# Patient Record
Sex: Female | Born: 1960 | Race: White | Hispanic: No | Marital: Married | Smoking: Never smoker
Health system: Southern US, Community
[De-identification: ages and names within clinical notes are randomized; demographics above are authoritative.]

## PROBLEM LIST (undated history)

## (undated) DIAGNOSIS — C801 Malignant (primary) neoplasm, unspecified: Secondary | ICD-10-CM

---

## 2011-07-23 ENCOUNTER — Ambulatory Visit: Payer: Self-pay | Admitting: Family Medicine

## 2013-08-04 ENCOUNTER — Ambulatory Visit: Payer: Self-pay | Admitting: Family Medicine

## 2016-07-22 ENCOUNTER — Other Ambulatory Visit: Payer: Self-pay | Admitting: Family Medicine

## 2016-07-22 DIAGNOSIS — Z1231 Encounter for screening mammogram for malignant neoplasm of breast: Secondary | ICD-10-CM

## 2016-07-29 ENCOUNTER — Ambulatory Visit
Admission: RE | Admit: 2016-07-29 | Discharge: 2016-07-29 | Disposition: A | Discharge status: Home / Self Care | Payer: BLUE CROSS/BLUE SHIELD | Source: Ambulatory Visit | Attending: Family Medicine | Admitting: Family Medicine

## 2016-07-29 ENCOUNTER — Encounter (INDEPENDENT_AMBULATORY_CARE_PROVIDER_SITE_OTHER): Payer: Self-pay

## 2016-07-29 DIAGNOSIS — Z1231 Encounter for screening mammogram for malignant neoplasm of breast: Secondary | ICD-10-CM | POA: Diagnosis present

## 2016-07-29 HISTORY — DX: Malignant (primary) neoplasm, unspecified: C80.1

## 2016-08-18 ENCOUNTER — Other Ambulatory Visit: Payer: Self-pay | Admitting: Neurology

## 2016-08-18 DIAGNOSIS — R42 Dizziness and giddiness: Secondary | ICD-10-CM

## 2016-08-28 ENCOUNTER — Ambulatory Visit
Admission: RE | Admit: 2016-08-28 | Discharge: 2016-08-28 | Disposition: A | Discharge status: Home / Self Care | Payer: BLUE CROSS/BLUE SHIELD | Source: Ambulatory Visit | Attending: Neurology | Admitting: Neurology

## 2016-08-28 DIAGNOSIS — R42 Dizziness and giddiness: Secondary | ICD-10-CM | POA: Insufficient documentation

## 2016-08-28 MED ORDER — GADOBENATE DIMEGLUMINE 529 MG/ML IV SOLN
13.0000 mL | Freq: Once | INTRAVENOUS | Status: AC | PRN
  Administered 2016-08-28: 13 mL via INTRAVENOUS

## 2017-10-19 ENCOUNTER — Other Ambulatory Visit: Payer: Self-pay | Admitting: Family Medicine

## 2017-10-19 DIAGNOSIS — Z1231 Encounter for screening mammogram for malignant neoplasm of breast: Secondary | ICD-10-CM

## 2017-10-21 ENCOUNTER — Other Ambulatory Visit: Payer: Self-pay | Admitting: Neurology

## 2017-10-21 DIAGNOSIS — D329 Benign neoplasm of meninges, unspecified: Secondary | ICD-10-CM

## 2017-10-29 ENCOUNTER — Ambulatory Visit: Admission: RE | Admit: 2017-10-29 | Payer: BLUE CROSS/BLUE SHIELD | Source: Ambulatory Visit

## 2017-11-05 ENCOUNTER — Ambulatory Visit
Admission: RE | Admit: 2017-11-05 | Discharge: 2017-11-05 | Disposition: A | Discharge status: Home / Self Care | Payer: BLUE CROSS/BLUE SHIELD | Source: Ambulatory Visit | Attending: Neurology | Admitting: Neurology

## 2017-11-05 ENCOUNTER — Encounter (INDEPENDENT_AMBULATORY_CARE_PROVIDER_SITE_OTHER): Payer: Self-pay

## 2017-11-05 DIAGNOSIS — D329 Benign neoplasm of meninges, unspecified: Secondary | ICD-10-CM | POA: Diagnosis not present

## 2017-11-05 MED ORDER — GADOBENATE DIMEGLUMINE 529 MG/ML IV SOLN
13.0000 mL | Freq: Once | INTRAVENOUS | Status: AC | PRN
  Administered 2017-11-05: 13 mL via INTRAVENOUS

## 2017-11-09 ENCOUNTER — Ambulatory Visit
Admission: RE | Admit: 2017-11-09 | Discharge: 2017-11-09 | Disposition: A | Discharge status: Home / Self Care | Payer: BLUE CROSS/BLUE SHIELD | Source: Ambulatory Visit | Attending: Family Medicine | Admitting: Family Medicine

## 2017-11-09 DIAGNOSIS — Z1231 Encounter for screening mammogram for malignant neoplasm of breast: Secondary | ICD-10-CM

## 2018-02-10 ENCOUNTER — Other Ambulatory Visit: Payer: Self-pay | Admitting: Family Medicine

## 2018-02-10 DIAGNOSIS — R102 Pelvic and perineal pain: Secondary | ICD-10-CM

## 2018-02-14 ENCOUNTER — Ambulatory Visit
Admission: RE | Admit: 2018-02-14 | Discharge: 2018-02-14 | Disposition: A | Discharge status: Home / Self Care | Payer: BLUE CROSS/BLUE SHIELD | Source: Ambulatory Visit | Attending: Family Medicine | Admitting: Family Medicine

## 2018-02-14 ENCOUNTER — Encounter (INDEPENDENT_AMBULATORY_CARE_PROVIDER_SITE_OTHER): Payer: Self-pay

## 2018-02-14 DIAGNOSIS — R102 Pelvic and perineal pain: Secondary | ICD-10-CM | POA: Diagnosis present

## 2018-02-14 DIAGNOSIS — N858 Other specified noninflammatory disorders of uterus: Secondary | ICD-10-CM | POA: Insufficient documentation

## 2018-11-21 ENCOUNTER — Other Ambulatory Visit: Payer: Self-pay | Admitting: Family Medicine

## 2018-11-21 DIAGNOSIS — Z1231 Encounter for screening mammogram for malignant neoplasm of breast: Secondary | ICD-10-CM

## 2018-11-28 ENCOUNTER — Other Ambulatory Visit: Payer: Self-pay

## 2018-11-28 ENCOUNTER — Ambulatory Visit
Admission: RE | Admit: 2018-11-28 | Discharge: 2018-11-28 | Disposition: A | Discharge status: Home / Self Care | Payer: BLUE CROSS/BLUE SHIELD | Source: Ambulatory Visit | Attending: Family Medicine | Admitting: Family Medicine

## 2018-11-28 DIAGNOSIS — Z1231 Encounter for screening mammogram for malignant neoplasm of breast: Secondary | ICD-10-CM | POA: Insufficient documentation

## 2019-06-14 ENCOUNTER — Other Ambulatory Visit: Payer: Self-pay

## 2019-06-14 ENCOUNTER — Ambulatory Visit: Payer: BC Managed Care – PPO | Attending: Neurology

## 2019-06-14 DIAGNOSIS — R42 Dizziness and giddiness: Secondary | ICD-10-CM | POA: Diagnosis present

## 2019-06-14 NOTE — Patient Instructions (Signed)
Access Code: [email or web address]  URL: [email or web address]  Date: 06/14/2019  Prepared by: Roxana Hires   Exercises Standing Gaze Stabilization with Head Rotation - 3 reps - 30 seconds hold - 4x daily - 7x weekly

## 2019-06-14 NOTE — Therapy (Addendum)
[place] [facility] SERVICES [address] [place], [place], [address] Phone: [phone]   Fax:  [phone]   Physical Therapy Evaluation  Patient Details  Name: Heather Mcbride MRN: [id number] Date of Birth: Aug 15, 1961 Referring Provider (PT): Vertell Novak Date: 06/14/2019  PT End of Session - 06/15/19 1500    Visit Number  1    Number of Visits  9    Date for PT Re-Evaluation  08/09/19    Authorization Type  eval 06/14/19    PT Start Time  1430    PT Stop Time  1535    PT Time Calculation (min)  65 min    Activity Tolerance  Patient tolerated treatment well    Behavior During Therapy  [facility] for tasks assessed/performed       Past Medical History:  Diagnosis Date  . Cancer ([place])    melanoma and squamous cell    History reviewed. No pertinent surgical history.  There were no vitals filed for this visit.   Subjective Assessment - 06/14/19 1452    Subjective  Dizziness    Pertinent History  Pt referred by Dr. Manuella Ghazi for dizziness. She arrives complaining of  worsening dizziness for the last year but according to neurology note she has been having symptoms since July 2017. 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 She states that she had a lot of headaches as a child but denies any previous diagnosis of migraines. Per neurology not,e pt has sensitivity to light and noise. She has had multiple episodes of dizziness each of which lasts multiple days. She also has borderline hypotension. The most recent event she reports occurred in March 2020 and she had three days of eye fatigue, dizziness, and headache. She states she this past Saturday she also started feeling unwell however it was not a full blow episode. Nevertheless she reports that over the last couple days she states that she has just not felt well. In addition to these acute episodes she is  complaining of slight dizziness with quick head turns and when bending forward. She also reports that she is very sensitive to motion and gets carsick easily. She has been wearing progressive lenses for the last year and a half which were hard for her to adjust to. She also has a history of a few head injuries the most recent of which occurred after falling from a horse in 2015. After this episode she reports some word-finding difficulty. She also had a head injury when she was 58 years old. She reports that stress is a trigger and she recently had a stressful event which triggered her episode and she had associated chest pain. She denies any SOB or heart palpitations. She has a history of a left bundle branch block but does not currently see a cardiologist. She reports that she was evaluated for this issue but there were not concerned about it at the time. She does have ADD and takes methylphenidate. She states she has had multiple tick bites and she spends a lot of time outdoors. Per neurology note her migraines have recently been improving.    Diagnostic tests  MRI, no acute changes, 3mm meningioma identified of no clinical significance    Patient Stated Goals  Decrease dizziness    Currently in Pain?  No/denies             VESTIBULAR AND BALANCE  EVALUATION   HISTORY:  Subjective history of current problem: Pt referred by Dr. Manuella Ghazi for dizziness. She arrives complaining of  worsening dizziness for the last year but according to neurology note she has been having symptoms since July 2017. 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 She states that she had a lot of headaches as a child but denies any previous diagnosis of migraines. Per neurology not,e pt has sensitivity to light and noise. She has had multiple episodes of dizziness each of which lasts multiple days. She also has borderline hypotension. The most recent  event she reports occurred in March 2020 and she had three days of eye fatigue, dizziness, and headache. She states she this past Saturday she also started feeling unwell however it was not a full blow episode. Nevertheless she reports that over the last couple days she states that she has just not felt well. In addition to these acute episodes she is complaining of slight dizziness with quick head turns and when bending forward. She also reports that she is very sensitive to motion and gets carsick easily. She has been wearing progressive lenses for the last year and a half which were hard for her to adjust to. She also has a history of a few head injuries the most recent of which occurred after falling from a horse in 2015. After this episode she reports some word-finding difficulty. She also had a head injury when she was 58 years old. She reports that stress is a trigger and she recently had a stressful event which triggered her episode and she had associated chest pain. She denies any SOB or heart palpitations. She has a history of a left bundle branch block but does not currently see a cardiologist. She reports that she was evaluated for this issue but there were not concerned about it at the time. She does have ADD and takes methylphenidate. She states she has had multiple tick bites and she spends a lot of time outdoors. Per neurology note her migraines have recently been improving.    Description of dizziness: (vertigo, unsteadiness, lightheadedness, falling, general unsteadiness, whoozy, swimmy-headed sensation, aural fullness) lightheadedness, dizziness, no complaints of vertigo Frequency: "a couple times per week" Duration: a couple seconds Symptom nature: (motion provoked, positional, spontaneous, constant, variable, intermittent) always motion-provoked and exertional.   Provocative Factors: Bright lights, smells, stress, sudden movements of the head, hx of low blood pressure Easing Factors:  resting  Progression of symptoms: (better, worse, no change since onset) No change History of similar episodes: 1-2 acute episodes a year for the last couple of years  Falls (yes/no): No Number of falls in past 6 months: No   Prior Functional Level: Independent  Auditory complaints (tinnitus, pain, drainage, hearing loss, aural fullness): No hearing, fullness, or ringing Vision (diplopia, visual field loss, recent changes, last eye exam): Denies diplopia, visual field loss. Last vision exam was over a year and was put into progressive lenses which took a long time to adjust to  Red Flags: (dysarthria, dysphagia, drop attacks, bowel and bladder changes, recent weight loss/gain) Review of systems negative for red flags.   MRI Brain with and without contrast done on 08/28/2016:  1. Largely unremarkable appearance of the brain for age. No cause of the patient's symptoms identified.  2. 6 mm enhancing focus associated with the falx which may reflect a tiny incidental meningioma or venous structure, not felt to be  of any clinical significance.  MRI Brain 11/05/2017 - 4 x 6 mm meningioma along the anterior falx unchanged from the prior study. Otherwise negative      EXAMINATION  POSTURE:   NEUROLOGICAL SCREEN: (2+ unless otherwise noted.) N=normal  Ab=abnormal  Level Dermatome R L Myotome R L Reflex R L  C3 Anterior Neck N N Sidebend C2-3 N N Jaw CN V    C4 Top of Shoulder N N Shoulder Shrug C4 N N Hoffman's UMN N N  C5 Lateral Upper Arm N N Shoulder ABD C4-5 N N Biceps C5-6    C6 Lateral Arm/ Thumb N N Arm Flex/ Wrist Ext C5-6 N N Brachiorad. C5-6    C7 Middle Finger N N Arm Ext//Wrist Flex C6-7 N N Triceps C7    C8 4th & 5th Finger N N Flex/ Ext Carpi Ulnaris C8 N N Patellar (L3-4)    T1 Medial Arm N N Interossei T1 N N Gastrocnemius    L2 Medial thigh/groin N N Illiopsoas (L2-3) N N     L3 Lower thigh/med.knee N N Quadriceps (L3-4) N N     L4 Medial leg/lat thigh N N Tibialis Ant  (L4-5) N N     L5 Lat. leg & dorsal foot N N EHL (L5) N N     S1 post/lat foot/thigh/leg N N Gastrocnemius (S1-2) N N     S2 Post./med. thigh & leg N N Hamstrings (L4-S3) N N       Cranial Nerves Visual acuity and visual fields are intact  Extraocular muscles are intact  Facial sensation is intact bilaterally  Facial strength is intact bilaterally  Hearing is normal as tested by gross conversation Palate elevates midline, normal phonation  Shoulder shrug strength is intact  Tongue protrudes midline    SOMATOSENSORY:         Sensation           Intact      Diminished         Absent  Light touch Normal      COORDINATION: Finger to Nose: Normal Heel to Shin: Normal Pronator Drift: Negative Rapid Alternating Movements: Normal Finger to Thumb Opposition: Normal  MUSCULOSKELETAL SCREEN: Cervical Spine ROM: WFL and painless in all planes. No gross deficits identified   ROM: WNL  MMT: WNL, no focal weakness  Functional Mobility: Independent with all transfers and ambulation without assistive device.   Gait: Scanning of visual environment with gait is: WNL without gross deficits noted   OCULOMOTOR / VESTIBULAR TESTING:  Oculomotor Exam- Room Light  Findings Comments  Ocular Alignment normal   Ocular ROM normal   Spontaneous Nystagmus normal   Gaze-Holding Nystagmus normal   End-Gaze Nystagmus normal   Vergence (normal 2-3") normal   Smooth Pursuit normal   Cross-Cover Test normal   Saccades normal   VOR Cancellation normal   Left Head Thrust abnormal Possibly positive to the left requiring a corrective saccade but very faint  Right Head Thrust normal   Static Acuity normal 20/13  Dynamic Acuity abnormal 20/25 (3 line loss)    Oculomotor Exam- Fixation Suppressed  Findings Comments  Ocular Alignment normal   Spontaneous Nystagmus normal   Gaze-Holding Nystagmus normal   End-Gaze Nystagmus normal   Head Shaking Nystagmus normal   Pressure-Induced Nystagmus  not examined   Hyperventilation Induced Nystagmus not examined   Skull Vibration Induced Nystagmus abnormal Possible faint R horizontal beating nystagmus    BPPV TESTS:  Symptoms Duration Intensity  Nystagmus  L Dix-Hallpike None   None  R Dix-Hallpike None   None  L Head Roll None   None  R Head Roll None   None  L Sidelying Test      R Sidelying Test        FUNCTIONAL OUTCOME MEASURES   Results Comments  DHI 28/100 Mild perception of handicap  ABC 94.4% WNL          [facility] PT Assessment - 06/14/19 1453      Assessment   Medical Diagnosis  Dizziness    Referring Provider (PT)  Manuella Ghazi    Onset Date/Surgical Date  06/13/18    Hand Dominance  Right    Next MD Visit  November 2020    Prior Therapy  None      Precautions   Precautions  None      Restrictions   Weight Bearing Restrictions  No      Balance Screen   Has the patient fallen in the past 6 months  No    Has the patient had a decrease in activity level because of a fear of falling?   No    Is the patient reluctant to leave their home because of a fear of falling?   No      Home Film/video editor residence    Living Arrangements  Spouse/significant other    Available Help at Discharge  Family    Type of [place]      Prior Function   Level of [place]  Retired    Leisure  Works around [employer], horseback riding, tennis, golf, shopping, camping      Cognition   Overall Cognitive Status  Within Functional Limits for tasks assessed      Observation/Other Assessments   Other Surveys   Dizziness Handicap Inventory ([place])    Dizziness Handicap Inventory ([place])   28/100                Objective measurements completed on examination: See above findings.       TREATMENT   Neuromuscular Re-education  VOR x 1 horizontal in sitting with patient x 60s, pt reports 7/10 dizziness. Did not perform a second rep but spent additional time  explaining how to perform HEP and how to modify as needed to appropriate intensity. Written handout provided and education given to both patient and husband.        PT Education - 06/15/19 1444    Education Details  Plan of care    Person(s) Educated  Patient;Spouse    Methods  Explanation    Comprehension  Verbalized understanding       PT Short Term Goals - 06/15/19 1512      PT SHORT TERM GOAL #1   Title  Pt will be independent with HEP in order to decrease dizziness in order to improve symptom-free function at home and with leisure activities    Time  4    Period  Weeks    Status  New    Target Date  07/12/19        PT Long Term Goals - 06/15/19 1512      PT LONG TERM GOAL #1   Title  Pt will decrease DHI score by at least 18 points in order to demonstrate clinically significant reduction in disability    Baseline  06/14/19: 28/100    Time  8  Period  Weeks    Status  New    Target Date  08/09/19      PT LONG TERM GOAL #2   Title  Pt will report no further episodes of dizziness while riding in the car in order to resume leisure activities such as going camping with her husband    Time  8    Period  Weeks    Status  New    Target Date  08/09/19             Plan - 06/15/19 1508    Clinical Impression Statement  Pt is a pleasant 58 year-old female referred for dizziness. PT examination reveals normal strength, sensation, and neurological testing. Oculomotor testing reveals possible positive head thrust to the L as well as faint pure horizontal R beating vibration-induced nystagmus. Pt also with 3 line loss during dynamic visual acuity testing. Otherwise, additional oculmotor and BPPV testing is negative on this date. Pt has acute episodes of dizziness which last multiple days and appear to be consistent with vestibular migraines for which she is being treated by neurology. 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 She reports significant increase in dizziness with VOR x 1 horizontal exercise which is encouraging for symptom reproduction. She will benefit from skilled PT services to address deficits in balance and decrease risk for future falls.    Personal Factors and Comorbidities  Comorbidity 3+    Comorbidities  borderline low BP, depression, LBBB, ADHD    Examination-Activity Limitations  Locomotion Level;Other   Bending   Examination-Participation Restrictions  Community Activity;Driving;Yard Work;Shop;Other   Leisure activities   Stability/Clinical Decision Making  Evolving/Moderate complexity    Clinical Decision Making  Moderate    Rehab Potential  Good    PT Frequency  1x / week    PT Duration  8 weeks    PT Treatment/Interventions  ADLs/Self Care Home Management;Aquatic Therapy;Biofeedback;Canalith Repostioning;Cryotherapy;Electrical Stimulation;Iontophoresis 4mg /ml Dexamethasone;Moist Heat;Traction;Ultrasound;DME Instruction;Gait training;Stair training;Functional mobility training;Therapeutic activities;Therapeutic exercise;Balance training;Neuromuscular re-education;Patient/family education;Manual techniques;Passive range of motion;Dry needling;Vestibular;Spinal Manipulations;Joint Manipulations    PT Next Visit Plan  DGI, mCTSIB, update goals with new outcome measures if needed, review VOR x 1 horizontal and progress as appropriate for HEP;    PT Home Exercise Plan  Access Code: [email or web address], VOR x 1 horizontal in sitting x 30s, 3 reps 4x/day, 7d/wk    Consulted and Agree with Plan of Care  Patient;Family member/caregiver    Family Member Consulted  Husband       Patient will benefit from skilled therapeutic intervention in order to improve the following deficits and impairments:  Dizziness  Visit Diagnosis: Dizziness and giddiness     Problem List There are no  active problems to display for this patient.  Phillips Grout PT, DPT, GCS  Perez Dirico 06/15/2019, 4:13 PM  [place] MAIN [facility] SERVICES [address] [place], [place], [address] Phone: [phone]   Fax:  [phone]  Name: Heather Mcbride MRN: [id number] Date of Birth: 1961-07-12

## 2019-06-15 NOTE — Addendum Note (Signed)
Addended by: Roxana Hires D on: 06/15/2019 04:15 PM   Modules accepted: Orders

## 2019-06-23 ENCOUNTER — Ambulatory Visit: Payer: BC Managed Care – PPO | Attending: Neurology

## 2019-06-23 ENCOUNTER — Other Ambulatory Visit: Payer: Self-pay

## 2019-06-23 DIAGNOSIS — R42 Dizziness and giddiness: Secondary | ICD-10-CM | POA: Diagnosis not present

## 2019-06-23 NOTE — Therapy (Signed)
[place] MAIN [facility] SERVICES [address] [place], [place], [address] Phone: [phone]   Fax:  [phone]  Physical Therapy Treatment  Patient Details  Name: Heather Mcbride MRN: [id number] Date of Birth: [birth year] Referring Provider (PT): Vertell Novak Date: 06/23/2019  PT End of Session - 06/23/19 1011    Visit Number  2    Number of Visits  9    Date for PT Re-Evaluation  08/09/19    Authorization Type  2/10 progress note    PT Start Time  0810    PT Stop Time  0853    PT Time Calculation (min)  43 min    Equipment Utilized During Treatment  Gait belt    Activity Tolerance  Patient tolerated treatment well    Behavior During Therapy  [facility] for tasks assessed/performed       Past Medical History:  Diagnosis Date  . Cancer ([place])    melanoma and squamous cell    History reviewed. No pertinent surgical history.  There were no vitals filed for this visit.  Subjective Assessment - 06/23/19 0812    Subjective  Patient reports she has been doing her home exercises and she states it is getting better. Patient reports she went camping for 3 nights and she said she was less symptomatic. Patient reports that she usually gets motion sensitivity with camping because of the movmeent in the motor home.    Pertinent History  Pt referred by Dr. Manuella Ghazi for dizziness. She arrives complaining of  worsening dizziness for the last year but according to neurology note she has been having symptoms since July 2017. 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 She states that she had a lot of headaches as a child but denies any previous diagnosis of migraines. Per neurology not,e pt has sensitivity to light and noise. She has had multiple episodes of dizziness each of which lasts multiple days. She also has borderline hypotension. The most recent event she reports occurred in  March 2020 and she had three days of eye fatigue, dizziness, and headache. She states she this past Saturday she also started feeling unwell however it was not a full blow episode. Nevertheless she reports that over the last couple days she states that she has just not felt well. In addition to these acute episodes she is complaining of slight dizziness with quick head turns and when bending forward. She also reports that she is very sensitive to motion and gets carsick easily. She has been wearing progressive lenses for the last year and a half which were hard for her to adjust to. She also has a history of a few head injuries the most recent of which occurred after falling from a horse in 2015. After this episode she reports some word-finding difficulty. She also had a head injury when she was 58 years old. She reports that stress is a trigger and she recently had a stressful event which triggered her episode and she had associated chest pain. She denies any SOB or heart palpitations. She has a history of a left bundle branch block but does not currently see a cardiologist. She reports that she was evaluated for this issue but there were not concerned about it at the time. She does have ADD and takes methylphenidate. She states she has had multiple tick bites and she spends a lot of time outdoors. Per  neurology note her migraines have recently been improving.    Diagnostic tests  MRI, no acute changes, 33mm meningioma identified of no clinical significance    Patient Stated Goals  Decrease dizziness        [facility] PT Assessment - 06/23/19 0001      Standardized Balance Assessment   Standardized Balance Assessment  Dynamic Gait Index      Dynamic Gait Index   Level Surface  Normal    Change in Gait Speed  Normal    Gait with Horizontal Head Turns  Normal    Gait with Vertical Head Turns  Normal    Gait and Pivot Turn  Normal    Step Over Obstacle  Normal    Step Around Obstacles  Normal    Steps   Normal    Total Score  24       Patient reports 0/10 dizziness upon arrival.  Neuromuscular Re-education:  VOR X 1 exercise:  Patient performed VOR X 1 horizontal in sitting 1 rep of 30 seconds with verbal cues for technique as initially patient was turning head rapidly and reporting target was moving and blurring. Patient instructed to move head more slowly trying to keep object in focus. Patient reports the X stays in focus but does still appear to move. Patient performed VOR X 1 using chin tuck technique.   Patient performed VOR X 1 horizontal in standing 3 reps of 1 minute each.  Patient reports the target "moves" despite slower speeds and reports 2/10 dizziness.   Body Wall Rolls:  Patient performed 4 reps of supported, body wall rolls with eyes open with CGA. Patient veered gently into wall once.  Patient reports mild dizziness with this activity.   Ambulation with head turns:  Patient performed 175' forwards and retro ambulation while scanning for targets in hallway with CGA.  Patient demonstrated one episode of mild veering that she was able to self-correct.  Patient with mild increased sway upon stopping ambulation.  Diona Foley toss to self:  Patient performed 175' forwards and then retro ambulation while tossing ball to self horizontal and then vertical while tracking ball with head and eyes with CGA.  Patient denies dizziness with this activity.    Clinical Test of Sensory Interaction for Balance    (CTSIB): CONDITION TIME STRATEGY SWAY  Eyes open, firm surface 30 seconds    Eyes closed, firm surface 30 seconds ankle +1  Eyes open, foam surface 30 seconds ankle   Eyes closed, foam surface 30 seconds ankle +2     PT Education - 06/23/19 1010    Education Details  reviewed VOR X1 and added progressions of performing in standing for 60 second reps    Person(s) Educated  Patient    Methods  Explanation    Comprehension  Verbalized understanding       PT Short Term Goals -  06/15/19 1512      PT SHORT TERM GOAL #1   Title  Pt will be independent with HEP in order to decrease dizziness in order to improve symptom-free function at home and with leisure activities    Time  4    Period  Weeks    Status  New    Target Date  07/12/19        PT Long Term Goals - 06/15/19 1512      PT LONG TERM GOAL #1   Title  Pt will decrease DHI score by at least 18 points in order  to demonstrate clinically significant reduction in disability    Baseline  06/14/19: 28/100    Time  8    Period  Weeks    Status  New    Target Date  08/09/19      PT LONG TERM GOAL #2   Title  Pt will report no further episodes of dizziness while riding in the car in order to resume leisure activities such as going camping with her husband    Time  8    Period  Weeks    Status  New    Target Date  08/09/19            Plan - 06/23/19 1011    Clinical Impression Statement  Patient reports compliance with HEP. Patient able to progress to standing VOR and progressed from 30 seconds to 1 minute reps. Patient challenged by retro ambulation with head turns and with ambulation with ball toss to self with head and eye follows. Performed the CTSIB test and patient challenged by eyes closed on firm and foam surfaces. Will plan on practicing eyes closed on foam exercises next sesion as well as working on [employer] with ball toss over shoulder. Patient would benefit from continued PT services to further address patient's symptoms and to address goals as set on plan of care.    Personal Factors and Comorbidities  Comorbidity 3+    Comorbidities  borderline low BP, depression, LBBB, ADHD    Examination-Activity Limitations  Locomotion Level;Other   Bending   Examination-Participation Restrictions  Community Activity;Driving;Yard Work;Shop;Other   Leisure activities   Stability/Clinical Decision Making  Evolving/Moderate complexity    Rehab Potential  Good    PT Frequency  1x / week    PT Duration   8 weeks    PT Treatment/Interventions  ADLs/Self Care Home Management;Aquatic Therapy;Biofeedback;Canalith Repostioning;Cryotherapy;Electrical Stimulation;Iontophoresis 4mg /ml Dexamethasone;Moist Heat;Traction;Ultrasound;DME Instruction;Gait training;Stair training;Functional mobility training;Therapeutic activities;Therapeutic exercise;Balance training;Neuromuscular re-education;Patient/family education;Manual techniques;Passive range of motion;Dry needling;Vestibular;Spinal Manipulations;Joint Manipulations    PT Next Visit Plan  DGI, mCTSIB, update goals with new outcome measures if needed, review VOR x 1 horizontal and progress as appropriate for HEP;    PT Home Exercise Plan  Access Code: [email or web address], VOR x 1 horizontal in sitting x 30s, 3 reps 4x/day, 7d/wk    Consulted and Agree with Plan of Care  Patient;Family member/caregiver    Family Member Consulted  Husband       Patient will benefit from skilled therapeutic intervention in order to improve the following deficits and impairments:  Dizziness  Visit Diagnosis: Dizziness and giddiness     Problem List There are no active problems to display for this patient.  Lady Deutscher PT, DPT [phone] Lady Deutscher 06/23/2019, 10:23 AM  [place] MAIN [facility] SERVICES [address] [place], [place], [address] Phone: [phone]   Fax:  [phone]  Name: Heather Mcbride MRN: [id number] Date of Birth: 05/09/1961

## 2019-06-30 ENCOUNTER — Other Ambulatory Visit: Payer: Self-pay

## 2019-06-30 ENCOUNTER — Ambulatory Visit: Payer: BC Managed Care – PPO

## 2019-06-30 DIAGNOSIS — R42 Dizziness and giddiness: Secondary | ICD-10-CM

## 2019-06-30 NOTE — Patient Instructions (Signed)
Access Code: [email or web address]  URL: [email or web address]  Date: 06/30/2019  Prepared by: Roxana Hires   Exercises Standing Gaze Stabilization with Head Rotation - 3 reps - 60 seconds hold - 4x daily - 7x weekly Half Tandem Stance Balance with Head Rotation - 3 reps - 30s x 3 with each foot forward hold - 4x daily - 7x weekly

## 2019-06-30 NOTE — Therapy (Signed)
[place] MAIN [facility] SERVICES [address] [place], [place], [address] Phone: [phone]   Fax:  [phone]  Physical Therapy Treatment  Patient Details  Name: Heather Mcbride MRN: [id number] Date of Birth: May 13, 1961 Referring Provider (PT): Vertell Novak Date: 06/30/2019  PT End of Session - 06/30/19 0852    Visit Number  3    Number of Visits  9    Date for PT Re-Evaluation  08/09/19    Authorization Type  3/10 progress note    PT Start Time  0812    PT Stop Time  0853    PT Time Calculation (min)  41 min    Equipment Utilized During Treatment  Gait belt    Activity Tolerance  Patient tolerated treatment well    Behavior During Therapy  [facility] for tasks assessed/performed       Past Medical History:  Diagnosis Date  . Cancer ([place])    melanoma and squamous cell    History reviewed. No pertinent surgical history.  There were no vitals filed for this visit.  Subjective Assessment - 06/30/19 0813    Subjective  Pt reports that she went kayaking this past week and felt some dizziness, but feels about 40% better from the start of therapy.    Pertinent History  Pt referred by Dr. Manuella Ghazi for dizziness. She arrives complaining of  worsening dizziness for the last year but according to neurology note she has been having symptoms since July 2017. 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 She states that she had a lot of headaches as a child but denies any previous diagnosis of migraines. Per neurology not,e pt has sensitivity to light and noise. She has had multiple episodes of dizziness each of which lasts multiple days. She also has borderline hypotension. The most recent event she reports occurred in March 2020 and she had three days of eye fatigue, dizziness, and headache. She states she this past Saturday she also started feeling unwell however it was not  a full blow episode. Nevertheless she reports that over the last couple days she states that she has just not felt well. In addition to these acute episodes she is complaining of slight dizziness with quick head turns and when bending forward. She also reports that she is very sensitive to motion and gets carsick easily. She has been wearing progressive lenses for the last year and a half which were hard for her to adjust to. She also has a history of a few head injuries the most recent of which occurred after falling from a horse in 2015. After this episode she reports some word-finding difficulty. She also had a head injury when she was 58 years old. She reports that stress is a trigger and she recently had a stressful event which triggered her episode and she had associated chest pain. She denies any SOB or heart palpitations. She has a history of a left bundle branch block but does not currently see a cardiologist. She reports that she was evaluated for this issue but there were not concerned about it at the time. She does have ADD and takes methylphenidate. She states she has had multiple tick bites and she spends a lot of time outdoors. Per neurology note her migraines have recently been improving.    Diagnostic tests  MRI, no acute changes, 44mm meningioma identified of no clinical significance  Patient Stated Goals  Decrease dizziness    Currently in Pain?  No/denies          TREATMENT   Neuromuscular Re-education:  VOR X 1 exercise:   Patient performed VOR X 1 horizontal in standing 4 reps of 1 minute each. 1st set against mirror with 0/10 dizziness reported. 2nd set against patterned curtain with feet together and 0/10 dizziness reported; 3rd set performed against patterned background in tandem stance with increased speed and 6/10 dizziness; 4th set with word "cat", feet together on patterned background with increased speed and 7/10 dizziness  Double Body Wall Rolls:  Patient performed  3 reps of supported, body wall rolls with eyes open with CGA. Patient veered gently into wall once.  Patient reports up to an 8/10 dizziness with exercise with more provocation going to the L  Ambulation with head turns:  Patient performed 2x75' forwards and retro ambulation while scanning for targets in hallway with CGA. No reported dizziness and no veering or unsteadiness noted throughout today.  Diona Foley toss to self:  Forward and Retro gait with vertical ball toss with head/eye follow 37' in each direction.  No dizziness reported  Forward and retro gait with horizontal ball passes from L hand to R with head/eye follow 73' in each direction.  2/10 dizziness reported with retro gait  Forward and retro gait with ball bounces to self alternating sides 75' in each direciton, Patient denies dizziness with this activity.     Horizontal head turns in // bars: 1/2 tandem with horizontal head turns and full tandem with horizontal head turns with increased dizziness noted up to a 4/10.     Pt demonstrates less provocation of symptoms today with VORx1 and forward and retrogait activities.  She reports significant dizziness with double body rolls, endorsing up to an 8/10 dizziness that was worse going to the L compared to the R.  Pt's HEP was progressed today to standing gaze stabilization with head rotations and half tandem stance balance with head turns.  Pt will benefit from skilled PT services to decrease dizziness and return to full function at home and in the community.           PT Short Term Goals - 06/15/19 1512      PT SHORT TERM GOAL #1   Title  Pt will be independent with HEP in order to decrease dizziness in order to improve symptom-free function at home and with leisure activities    Time  4    Period  Weeks    Status  New    Target Date  07/12/19        PT Long Term Goals - 06/15/19 1512      PT LONG TERM GOAL #1   Title  Pt will decrease DHI score by at least 18  points in order to demonstrate clinically significant reduction in disability    Baseline  06/14/19: 28/100    Time  8    Period  Weeks    Status  New    Target Date  08/09/19      PT LONG TERM GOAL #2   Title  Pt will report no further episodes of dizziness while riding in the car in order to resume leisure activities such as going camping with her husband    Time  8    Period  Weeks    Status  New    Target Date  08/09/19  Plan - 06/30/19 1017    Clinical Impression Statement  Pt demonstrates less provocation of symptoms today with VORx1 and forward and retrogait activities.  She reports significant dizziness with double body rolls, endorsing up to an 8/10 dizziness that was worse going to the L compared to the R.  Pt's HEP was progressed today to standing gaze stabilization with head rotations and half tandem stance balance with head turns.  Pt will benefit from skilled PT services to decrease dizziness and return to full function at home and in the community.    Personal Factors and Comorbidities  Comorbidity 3+    Comorbidities  borderline low BP, depression, LBBB, ADHD    Examination-Activity Limitations  Locomotion Level;Other   Bending   Examination-Participation Restrictions  Community Activity;Driving;Yard Work;Shop;Other   Leisure activities   Stability/Clinical Decision Making  Evolving/Moderate complexity    Rehab Potential  Good    PT Frequency  1x / week    PT Duration  8 weeks    PT Treatment/Interventions  ADLs/Self Care Home Management;Aquatic Therapy;Biofeedback;Canalith Repostioning;Cryotherapy;Electrical Stimulation;Iontophoresis 4mg /ml Dexamethasone;Moist Heat;Traction;Ultrasound;DME Instruction;Gait training;Stair training;Functional mobility training;Therapeutic activities;Therapeutic exercise;Balance training;Neuromuscular re-education;Patient/family education;Manual techniques;Passive range of motion;Dry needling;Vestibular;Spinal  Manipulations;Joint Manipulations    PT Next Visit Plan  DGI, mCTSIB, update goals with new outcome measures if needed, review VOR x 1 horizontal and progress as appropriate for HEP;    PT Home Exercise Plan  Access Code: [email or web address], VOR x 1 horizontal in standing x 60s, 3 reps 4x/day, 7d/wk in front of patterned background, semitandem horizontal head turns;    Consulted and Agree with Plan of Care  Patient       Patient will benefit from skilled therapeutic intervention in order to improve the following deficits and impairments:  Dizziness  Visit Diagnosis: Dizziness and giddiness     Problem List There are no active problems to display for this patient.   This entire session was performed under direct supervision and direction of a licensed therapist/therapist assistant . I have personally read, edited and approve of the note as written.   Lutricia Horsfall, SPT Phillips Grout PT, DPT, GCS  Huprich,Jason 06/30/2019, 12:01 PM  [place] MAIN [facility] SERVICES [address] [place], [place], [address] Phone: [phone]   Fax:  [phone]  Name: Heather Mcbride MRN: [id number] Date of Birth: [birth year]

## 2019-07-06 ENCOUNTER — Ambulatory Visit: Payer: BC Managed Care – PPO

## 2019-07-13 ENCOUNTER — Ambulatory Visit: Payer: BC Managed Care – PPO

## 2019-07-13 ENCOUNTER — Other Ambulatory Visit: Payer: Self-pay

## 2019-07-13 DIAGNOSIS — R42 Dizziness and giddiness: Secondary | ICD-10-CM

## 2019-07-13 NOTE — Therapy (Addendum)
[place] MAIN [facility] SERVICES [address] [place], [place], [address] Phone: [phone]   Fax:  [phone]  Physical Therapy Treatment  Patient Details  Name: Heather Mcbride MRN: [id number] Date of Birth: 1961-01-11 Referring Provider (PT): Vertell Novak Date: 07/13/2019  PT End of Session - 07/13/19 1119    Visit Number  4    Number of Visits  9    Date for PT Re-Evaluation  08/09/19    Authorization Type  --    PT Start Time  1120    PT Stop Time  1205    PT Time Calculation (min)  45 min    Equipment Utilized During Treatment  Gait belt    Activity Tolerance  Patient tolerated treatment well    Behavior During Therapy  WFL for tasks assessed/performed       Past Medical History:  Diagnosis Date  . Cancer ([place])    melanoma and squamous cell    History reviewed. No pertinent surgical history.  There were no vitals filed for this visit.  Subjective Assessment - 07/13/19 1123    Subjective  Pt reports that she has been doing her HEP twice a day, but has not been able to do them more than that.  She reports that she has had a few bad days after last session lasting up to 3 days.  She reports that she feels better today.    Pertinent History  Pt referred by Dr. Manuella Ghazi for dizziness. She arrives complaining of  worsening dizziness for the last year but according to neurology note she has been having symptoms since July 2017. 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 She states that she had a lot of headaches as a child but denies any previous diagnosis of migraines. Per neurology not,e pt has sensitivity to light and noise. She has had multiple episodes of dizziness each of which lasts multiple days. She also has borderline hypotension. The most recent event she reports occurred in March 2020 and she had three days of eye fatigue, dizziness, and  headache. She states she this past Saturday she also started feeling unwell however it was not a full blow episode. Nevertheless she reports that over the last couple days she states that she has just not felt well. In addition to these acute episodes she is complaining of slight dizziness with quick head turns and when bending forward. She also reports that she is very sensitive to motion and gets carsick easily. She has been wearing progressive lenses for the last year and a half which were hard for her to adjust to. She also has a history of a few head injuries the most recent of which occurred after falling from a horse in 2015. After this episode she reports some word-finding difficulty. She also had a head injury when she was 58 years old. She reports that stress is a trigger and she recently had a stressful event which triggered her episode and she had associated chest pain. She denies any SOB or heart palpitations. She has a history of a left bundle branch block but does not currently see a cardiologist. She reports that she was evaluated for this issue but there were not concerned about it at the time. She does have ADD and takes methylphenidate. She states she has had multiple tick bites and she spends a lot of time outdoors. Per neurology  note her migraines have recently been improving.    Diagnostic tests  MRI, no acute changes, 109mm meningioma identified of no clinical significance    Patient Stated Goals  Decrease dizziness    Currently in Pain?  No/denies          TREATMENT   Neuromuscular Re-education  VOR X 1 exercise  VORx1 horizontal in semitandem on firm surface 2x60; reports 2/10 dizziness that resolves quickly after stopping  Patient performed VOR X 1 horizontal in standing on foam with patterned background; 1x60s; 1/10 dizziness with feet shoulder width apart with a chin tuck; 1x60s with feet together on foam 1/10 dizziness resolving quickly  VORx1 horizontal with forward  walking 3x forward and 3x backwards 1/10 dizziness reported with forward gait, and 1/10 during first 2 reps, and 2/10 dizziness during 3rd rep.  Ambulation with 180 degree turns:  Forward gait with x6 turns to the L and x6 turns to the R; pt reports 0/10 dizziness  Ball toss to self:  Forward and Retro gait with vertical ball toss with head/eye follow 32' in each direction.  1/10 dizziness reported  Forward and retro gait with horizontal ball passes from L hand to R with head/eye follow 76' in each direction.  1/10 dizziness reported with retro gait  Forward and retro gait with ball bounces to self alternating sides 75' in each direciton, Patient denies dizziness with this activity.   Lateral ball passes in door frame with head/turn follow x60s; 0/10 dizziness reported    Pt reports less provocation of symptoms today with all exercises, endorsing at most a 1/10 dizziness.  She reports that the main thing that sets off her symptoms at home is walking her horses in a circular pattern while trying to focus on the horse.  She will re-initiate single body rolls at home this week, starting with 3 in each direction once a day, and then progressing as tolerated.  Pt will benefit from skilled PT services to decrease dizziness and return to full function at home and in the community.                         PT Short Term Goals - 06/15/19 1512      PT SHORT TERM GOAL #1   Title  Pt will be independent with HEP in order to decrease dizziness in order to improve symptom-free function at home and with leisure activities    Time  4    Period  Weeks    Status  New    Target Date  07/12/19        PT Long Term Goals - 06/15/19 1512      PT LONG TERM GOAL #1   Title  Pt will decrease DHI score by at least 18 points in order to demonstrate clinically significant reduction in disability    Baseline  06/14/19: 28/100    Time  8    Period  Weeks    Status  New    Target  Date  08/09/19      PT LONG TERM GOAL #2   Title  Pt will report no further episodes of dizziness while riding in the car in order to resume leisure activities such as going camping with her husband    Time  8    Period  Weeks    Status  New    Target Date  08/09/19  Plan - 07/13/19 1229    Clinical Impression Statement  Pt reports less provocation of symptoms today with all exercises, endorsing at most a 1/10 dizziness.  She reports that the main thing that sets off her symptoms at home is walking her horses in a circular pattern while trying to focus on the horse.  She will re-initiate single body rolls at home this week, starting with 3 in each direction once a day, and then progressing as tolerated.  Pt will benefit from skilled PT services to decrease dizziness and return to full function at home and in the community.    Personal Factors and Comorbidities  Comorbidity 3+    Comorbidities  borderline low BP, depression, LBBB, ADHD    Examination-Activity Limitations  Locomotion Level;Other   Bending   Examination-Participation Restrictions  Community Activity;Driving;Yard Work;Shop;Other   Leisure activities   Stability/Clinical Decision Making  Evolving/Moderate complexity    Rehab Potential  Good    PT Frequency  1x / week    PT Duration  8 weeks    PT Treatment/Interventions  ADLs/Self Care Home Management;Aquatic Therapy;Biofeedback;Canalith Repostioning;Cryotherapy;Electrical Stimulation;Iontophoresis 4mg /ml Dexamethasone;Moist Heat;Traction;Ultrasound;DME Instruction;Gait training;Stair training;Functional mobility training;Therapeutic activities;Therapeutic exercise;Balance training;Neuromuscular re-education;Patient/family education;Manual techniques;Passive range of motion;Dry needling;Vestibular;Spinal Manipulations;Joint Manipulations    PT Next Visit Plan  DGI, mCTSIB, update goals with new outcome measures if needed, review VOR x 1 horizontal and progress as  appropriate for HEP;    PT Home Exercise Plan  Access Code: [email or web address], VOR x 1 horizontal in standing x 60s, 3 reps 4x/day, 7d/wk in front of patterned background, semitandem horizontal head turns;    Consulted and Agree with Plan of Care  Patient       Patient will benefit from skilled therapeutic intervention in order to improve the following deficits and impairments:  Dizziness  Visit Diagnosis: Dizziness and giddiness     Problem List There are no active problems to display for this patient.   This entire session was performed under direct supervision and direction of a licensed therapist/therapist assistant . I have personally read, edited and approve of the note as written.   Lutricia Horsfall, SPT Phillips Grout PT, DPT, GCS  Huprich,Jason 07/14/2019, 1:05 PM  [place] MAIN [facility] SERVICES [address] [place], [place], [address] Phone: [phone]   Fax:  [phone]  Name: Heather Mcbride MRN: [id number] Date of Birth: [birth year]

## 2019-07-20 ENCOUNTER — Ambulatory Visit: Payer: BC Managed Care – PPO

## 2019-07-26 ENCOUNTER — Ambulatory Visit: Payer: BC Managed Care – PPO | Attending: Neurology

## 2019-07-26 ENCOUNTER — Other Ambulatory Visit: Payer: Self-pay

## 2019-07-26 DIAGNOSIS — R42 Dizziness and giddiness: Secondary | ICD-10-CM

## 2019-07-26 NOTE — Therapy (Signed)
[place] MAIN [facility] SERVICES [address] [place], [place], [address] Phone: [phone]   Fax:  [phone]  Physical Therapy Treatment  Patient Details  Name: Heather Mcbride MRN: [id number] Date of Birth: 08-17-1961 Referring Provider (PT): Vertell Novak Date: 07/26/2019  PT End of Session - 07/26/19 1118    Visit Number  5    Number of Visits  9    Date for PT Re-Evaluation  08/09/19    PT Start Time  1118    PT Stop Time  1150    PT Time Calculation (min)  32 min    Equipment Utilized During Treatment  Gait belt    Activity Tolerance  Patient tolerated treatment well    Behavior During Therapy  WFL for tasks assessed/performed       Past Medical History:  Diagnosis Date  . Cancer ([place])    melanoma and squamous cell    History reviewed. No pertinent surgical history.  There were no vitals filed for this visit.  Subjective Assessment - 07/26/19 1217    Subjective  Pt reports that she hasn't experienced any dizziness recently.  She reports that she hasn't been able to do her HEP consistently, but states that her dizziness has not been problematic.    Pertinent History  Pt referred by Dr. Manuella Ghazi for dizziness. She arrives complaining of  worsening dizziness for the last year but according to neurology note she has been having symptoms since July 2017. 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 She states that she had a lot of headaches as a child but denies any previous diagnosis of migraines. Per neurology not,e pt has sensitivity to light and noise. She has had multiple episodes of dizziness each of which lasts multiple days. She also has borderline hypotension. The most recent event she reports occurred in March 2020 and she had three days of eye fatigue, dizziness, and headache. She states she this past Saturday she also started feeling unwell  however it was not a full blow episode. Nevertheless she reports that over the last couple days she states that she has just not felt well. In addition to these acute episodes she is complaining of slight dizziness with quick head turns and when bending forward. She also reports that she is very sensitive to motion and gets carsick easily. She has been wearing progressive lenses for the last year and a half which were hard for her to adjust to. She also has a history of a few head injuries the most recent of which occurred after falling from a horse in 2015. After this episode she reports some word-finding difficulty. She also had a head injury when she was 58 years old. She reports that stress is a trigger and she recently had a stressful event which triggered her episode and she had associated chest pain. She denies any SOB or heart palpitations. She has a history of a left bundle branch block but does not currently see a cardiologist. She reports that she was evaluated for this issue but there were not concerned about it at the time. She does have ADD and takes methylphenidate. She states she has had multiple tick bites and she spends a lot of time outdoors. Per neurology note her migraines have recently been improving.    Diagnostic tests  MRI, no acute changes, 34m meningioma identified of no clinical significance  Patient Stated Goals  Decrease dizziness    Currently in Pain?  No/denies         TREATMENT   Neuromuscular Re-education  DHI: 14/100  VOR X 1 exercise   VORx1 horizontal with forward walking forward x1 and backwards x1 with 1/10 dizziness reported with forward gait, and 2 after retro gait.  Ambulation with 180 degree turns:  Forward gait with x6 turns to the L and x6 turns to the R; pt reports 0/10 dizziness  Ball toss to self: Forward and Retro gait with vertical ball raises with head/eye follow 53' in each direction. 0/10 dizziness reported  Forward and retro  gait with horizontal ball passes from L hand to R with head/eye follow 23' in each direction. 1/10 dizziness reported with retro gait  Wall Rolls: Single wall body rolls x1 in each direction with 0/10 dizziness reported  Double body wall rolls x1 in each direction with 1/10 dizziness reported     Pt reports less provocation of symptoms today with all exercises, endorsing at most a 1/10 dizziness.  She reports that she hasn't had any episodes recently, and that she has been able to do everything she wants to do without purposefully avoiding activities.  She endorses a 14/100 on the [facility] today, and reports that while she hasn't been camping in a few weeks, she states she would not avoid this activity anymore due to her symptoms.  She has had difficulty with performing her HEP consistently, however has not had any dizziness triggers in the last few weeks. She feels confident continuing her HEP independently going forward. Will plan on discharging today however will not entirely resolve episode in the chance that pt has a recurrent episodes over the next couple weeks.                        PT Short Term Goals - 07/26/19 1224      PT SHORT TERM GOAL #1   Title  Pt will be independent with HEP in order to decrease dizziness in order to improve symptom-free function at home and with leisure activities    Baseline  11/11: pt states that she is independne tiwth her exercises and has no questions about them, however she has not be consistent with completing them.    Time  4    Period  Weeks    Status  Partially Met    Target Date  07/12/19        PT Long Term Goals - 07/26/19 1226      PT LONG TERM GOAL #1   Title  Pt will decrease DHI score by at least 18 points in order to demonstrate clinically significant reduction in disability    Baseline  06/14/19: 28/100; 07/26/19: 14/100    Time  8    Period  Weeks    Status  Partially Met    Target Date  08/09/19      PT  LONG TERM GOAL #2   Title  Pt will report no further episodes of dizziness while riding in the car in order to resume leisure activities such as going camping with her husband    Baseline  07/26/19: pt states that she no longer avoids riding in the car or camping due to symptoms, reporting that her symptoms have resolved.    Time  8    Period  Weeks    Status  Achieved  Plan - 07/26/19 1223    Clinical Impression Statement  Pt reports less provocation of symptoms today with all exercises, endorsing at most a 1/10 dizziness.  She reports that she hasn't had any episodes recently, and that she has been able to do everything she wants to do without purposefully avoiding activities.  She endorses a 14/100 on the [facility] today, and reports that while she hasn't been camping in a few weeks, she states she would not avoid this activity anymore due to her symptoms.  She has had difficulty with performing her HEP consistently, however has not had any dizziness triggers in the last few weeks. She feels confident continuing her HEP independently going forward. Will plan on discharging today however will not entirely resolve episode in the chance that pt has a recurrent episodes over the next couple weeks.    Personal Factors and Comorbidities  Comorbidity 3+    Comorbidities  borderline low BP, depression, LBBB, ADHD    Examination-Activity Limitations  Locomotion Level;Other   Bending   Examination-Participation Restrictions  Community Activity;Driving;Yard Work;Shop;Other   Leisure activities   Stability/Clinical Decision Making  Evolving/Moderate complexity    Rehab Potential  Good    PT Frequency  1x / week    PT Duration  8 weeks    PT Treatment/Interventions  ADLs/Self Care Home Management;Aquatic Therapy;Biofeedback;Canalith Repostioning;Cryotherapy;Electrical Stimulation;Iontophoresis 37m/ml Dexamethasone;Moist Heat;Traction;Ultrasound;DME Instruction;Gait training;Stair  training;Functional mobility training;Therapeutic activities;Therapeutic exercise;Balance training;Neuromuscular re-education;Patient/family education;Manual techniques;Passive range of motion;Dry needling;Vestibular;Spinal Manipulations;Joint Manipulations    PT Next Visit Plan  Tenative discharge    PT Home Exercise Plan  Access Code: [email or web address], VOR x 1 horizontal in standing x 60s, 3 reps 4x/day, 7d/wk in front of patterned background, semitandem horizontal head turns;    Consulted and Agree with Plan of Care  Patient       Patient will benefit from skilled therapeutic intervention in order to improve the following deficits and impairments:  Dizziness  Visit Diagnosis: Dizziness and giddiness     Problem List There are no active problems to display for this patient.   This entire session was performed under direct supervision and direction of a licensed therapist/therapist assistant . I have personally read, edited and approve of the note as written.   TLutricia Horsfall SPT JPhillips GroutPT, DPT, GCS  Huprich,Jason 07/26/2019, 2:11 PM  [place] [address] [address]Phone: [phone]  Fax:  [phone] Name: Heather WIBLEMRN: [id number] of Birth: 8Oct 07, 1962

## 2019-08-16 ENCOUNTER — Ambulatory Visit: Payer: BC Managed Care – PPO

## 2020-03-05 ENCOUNTER — Other Ambulatory Visit: Payer: Self-pay

## 2020-03-05 DIAGNOSIS — Z1231 Encounter for screening mammogram for malignant neoplasm of breast: Secondary | ICD-10-CM

## 2020-03-12 ENCOUNTER — Other Ambulatory Visit: Payer: Self-pay

## 2020-03-12 ENCOUNTER — Ambulatory Visit
Admission: RE | Admit: 2020-03-12 | Discharge: 2020-03-12 | Disposition: A | Discharge status: Home / Self Care | Payer: BC Managed Care – PPO | Source: Ambulatory Visit

## 2020-03-12 DIAGNOSIS — Z1231 Encounter for screening mammogram for malignant neoplasm of breast: Secondary | ICD-10-CM | POA: Diagnosis not present

## 2021-09-15 ENCOUNTER — Other Ambulatory Visit: Payer: Self-pay

## 2021-09-15 ENCOUNTER — Ambulatory Visit
Admission: RE | Admit: 2021-09-15 | Discharge: 2021-09-15 | Disposition: A | Discharge status: Home / Self Care | Payer: BC Managed Care – PPO | Source: Ambulatory Visit | Attending: Physician Assistant | Admitting: Physician Assistant

## 2021-09-15 VITALS — BP 134/74 | HR 94 | Temp 99.1°F | Resp 18 | Ht 62.0 in | Wt 143.0 lb

## 2021-09-15 DIAGNOSIS — R5383 Other fatigue: Secondary | ICD-10-CM | POA: Diagnosis not present

## 2021-09-15 DIAGNOSIS — J020 Streptococcal pharyngitis: Secondary | ICD-10-CM | POA: Diagnosis not present

## 2021-09-15 LAB — GROUP A STREP BY PCR: Group A Strep by PCR: DETECTED — AB

## 2021-09-15 MED ORDER — AMOXICILLIN 500 MG PO CAPS: 500.0000 mg | ORAL_CAPSULE | Freq: Two times a day (BID) | ORAL | 0 refills | Status: AC

## 2021-09-15 NOTE — ED Provider Notes (Signed)
[facility]    CSN: [id number] Arrival date & time: 09/15/21  1041      History   Chief Complaint Chief Complaint  Patient presents with   Headache   Sore Throat    HPI KOURTNEY TERRIQUEZ is a 61 y.o. female presenting for 3-day history of severe sore throat, fatigue and headaches.  Patient reports 1 month ago she had flulike symptoms that lasted for 2 to 3 weeks.  Patient reports that she had only recovered from that for about a week before she got sick again.  Patient's husband had tested positive for flu when she had flulike symptoms.  She has had numerous negative COVID test over the past several weeks.  Reports history of COVID-19 a year ago.  Vaccinated for COVID-19 and boosted x1.  Has not had flu shot.  Reports recent fevers but has been taking over-the-counter medicine.  No cough or congestion or breathing issue.  No other complaints.  HPI  Past Medical History:  Diagnosis Date   Cancer ([place])    melanoma and squamous cell    There are no problems to display for this patient.   History reviewed. No pertinent surgical history.  OB History   No obstetric history on file.      Home Medications    Prior to Admission medications   Medication Sig Start Date End Date Taking? Authorizing Provider  amoxicillin (AMOXIL) 500 MG capsule Take 1 capsule (500 mg total) by mouth 2 (two) times daily for 10 days. 09/15/21 09/25/21 Yes Danton Clap, PA-C  meloxicam (MOBIC) 15 MG tablet Take 15 mg by mouth daily.   Yes [provider]  methylphenidate (METADATE CD) 20 MG CR capsule Take 20 mg by mouth every morning.   Yes [provider]  vitamin B-12 (CYANOCOBALAMIN) 1000 MCG tablet Take 1,000 mcg by mouth daily.   Yes [provider]    Family History Family History  Problem Relation Age of Onset   Breast cancer Neg Hx     Social History Social History   Tobacco Use   Smoking status: Never   Smokeless tobacco: Never  Vaping  Use   Vaping Use: Never used  Substance Use Topics   Alcohol use: Yes   Drug use: Never     Allergies   Patient has no allergy information on record.   Review of Systems Review of Systems  Constitutional:  Positive for fatigue and fever. Negative for chills and diaphoresis.  HENT:  Positive for sore throat. Negative for congestion, ear pain, rhinorrhea, sinus pressure and sinus pain.   Respiratory:  Negative for cough and shortness of breath.   Gastrointestinal:  Negative for abdominal pain, nausea and vomiting.  Musculoskeletal:  Negative for arthralgias and myalgias.  Skin:  Negative for rash.  Neurological:  Positive for headaches. Negative for weakness.  Hematological:  Positive for adenopathy.    Physical Exam Triage Vital Signs ED Triage Vitals  Enc Vitals Group     BP 09/15/21 1103 134/74     Pulse Rate 09/15/21 1103 94     Resp 09/15/21 1103 18     Temp 09/15/21 1103 99.1 F (37.3 C)     Temp Source 09/15/21 1103 Oral     SpO2 09/15/21 1103 99 %     Weight 09/15/21 1101 143 lb (64.9 kg)     Height 09/15/21 1101 5\' 2"  (1.575 m)     Head Circumference --      Peak  Flow --      Pain Score 09/15/21 1101 0     Pain Loc --      Pain Edu? --      Excl. in [place]? --    No data found.  Updated Vital Signs BP 134/74 (BP Location: Left Arm)    Pulse 94    Temp 99.1 F (37.3 C) (Oral)    Resp 18    Ht 5\' 2"  (1.575 m)    Wt 143 lb (64.9 kg)    SpO2 99%    BMI 26.16 kg/m   Physical Exam Vitals and nursing note reviewed.  Constitutional:      General: She is not in acute distress.    Appearance: Normal appearance. She is well-developed. She is ill-appearing. She is not toxic-appearing.  HENT:     Head: Normocephalic and atraumatic.     Nose: Nose normal.     Mouth/Throat:     Mouth: Mucous membranes are moist.     Pharynx: Oropharynx is clear. Posterior oropharyngeal erythema present.  Eyes:     General: No scleral icterus.       Right eye: No discharge.         Left eye: No discharge.     Conjunctiva/sclera: Conjunctivae normal.  Cardiovascular:     Rate and Rhythm: Normal rate and regular rhythm.     Heart sounds: Normal heart sounds.  Pulmonary:     Effort: Pulmonary effort is normal. No respiratory distress.     Breath sounds: Normal breath sounds.  Musculoskeletal:     Cervical back: Neck supple.  Lymphadenopathy:     Cervical: Cervical adenopathy present.  Skin:    General: Skin is dry.  Neurological:     General: No focal deficit present.     Mental Status: She is alert. Mental status is at baseline.     Motor: No weakness.     Gait: Gait normal.  Psychiatric:        Mood and Affect: Mood normal.        Behavior: Behavior normal.        Thought Content: Thought content normal.     UC Treatments / Results  Labs (all labs ordered are listed, but only abnormal results are displayed) Labs Reviewed  GROUP A STREP BY PCR - Abnormal; Notable for the following components:      Result Value   Group A Strep by PCR DETECTED (*)    All other components within normal limits    EKG   Radiology No results found.  Procedures Procedures (including critical care time)  Medications Ordered in UC Medications - No data to display  Initial Impression / Assessment and Plan / UC Course  I have reviewed the triage vital signs and the nursing notes.  Pertinent labs & imaging results that were available during my care of the patient were reviewed by me and considered in my medical decision making (see chart for details).  61 year old female presenting for 3-day history of severe sore throat, headaches, fatigue, body aches.  Reports having the flu a couple weeks ago.  Vitals all stable.  Patient is ill-appearing and was asleep in the exam room when I went in to see her.  On exam she has erythema posterior pharynx and bilateral tender enlarged anterior cervical lymph nodes.  Chest clear to auscultation heart regular rate and rhythm.  PCR  strep test obtained and positive.  Discussed result patient.  Treating with amoxicillin.  Also  encouraged increasing rest and fluids, Tylenol/Motrin as needed for discomfort.  Reviewed return and ER precautions.   Final Clinical Impressions(s) / UC Diagnoses   Final diagnoses:  Streptococcal sore throat  Other fatigue   Discharge Instructions   None    ED Prescriptions     Medication Sig Dispense Auth. Provider   amoxicillin (AMOXIL) 500 MG capsule Take 1 capsule (500 mg total) by mouth 2 (two) times daily for 10 days. 20 capsule Danton Clap, PA-C      PDMP not reviewed this encounter.   Danton Clap, PA-C 09/15/21 1155

## 2021-09-15 NOTE — ED Triage Notes (Signed)
Pt c/o sore throat, headache, x50month. Pt states that she had taken a home covid test and it was negative. Pt states that the last 3 days were worse as she has stayed in the bed due to sore throat and headaches.

## 2021-12-18 ENCOUNTER — Other Ambulatory Visit: Payer: Self-pay

## 2021-12-18 ENCOUNTER — Other Ambulatory Visit: Payer: Self-pay | Admitting: Family Medicine

## 2021-12-18 DIAGNOSIS — Z1231 Encounter for screening mammogram for malignant neoplasm of breast: Secondary | ICD-10-CM

## 2022-02-03 ENCOUNTER — Ambulatory Visit
Admission: RE | Admit: 2022-02-03 | Discharge: 2022-02-03 | Disposition: A | Discharge status: Home / Self Care | Payer: BC Managed Care – PPO | Source: Ambulatory Visit | Attending: Family Medicine | Admitting: Family Medicine

## 2022-02-03 ENCOUNTER — Ambulatory Visit: Payer: BC Managed Care – PPO

## 2022-02-03 DIAGNOSIS — Z1231 Encounter for screening mammogram for malignant neoplasm of breast: Secondary | ICD-10-CM | POA: Insufficient documentation

## 2022-12-23 ENCOUNTER — Other Ambulatory Visit: Payer: Self-pay | Admitting: Internal Medicine

## 2022-12-23 DIAGNOSIS — Z1231 Encounter for screening mammogram for malignant neoplasm of breast: Secondary | ICD-10-CM

## 2023-02-17 ENCOUNTER — Ambulatory Visit
Admission: RE | Admit: 2023-02-17 | Discharge: 2023-02-17 | Disposition: A | Discharge status: Home / Self Care | Payer: BC Managed Care – PPO | Source: Ambulatory Visit | Attending: Internal Medicine | Admitting: Internal Medicine

## 2023-02-17 DIAGNOSIS — Z1231 Encounter for screening mammogram for malignant neoplasm of breast: Secondary | ICD-10-CM

## 2023-11-27 IMAGING — MG MM DIGITAL SCREENING BILAT W/ TOMO AND CAD
8 series · 9 of 24 positions shown · non-contrast
Comparison: Previous exam(s).

CLINICAL DATA: Screening.

EXAM:
DIGITAL SCREENING BILATERAL MAMMOGRAM WITH TOMOSYNTHESIS AND CAD
TECHNIQUE: Bilateral screening digital craniocaudal and mediolateral oblique
mammograms were obtained. Bilateral screening digital breast
tomosynthesis was performed. The images were evaluated with
computer-aided detection.

[R CC synth-2D]
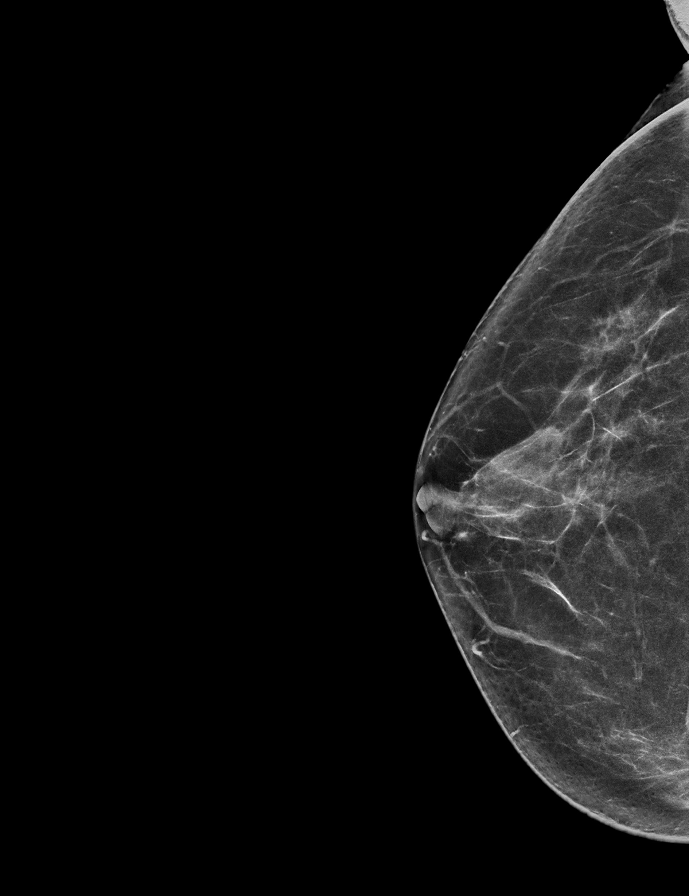

[R MLO synth-2D]
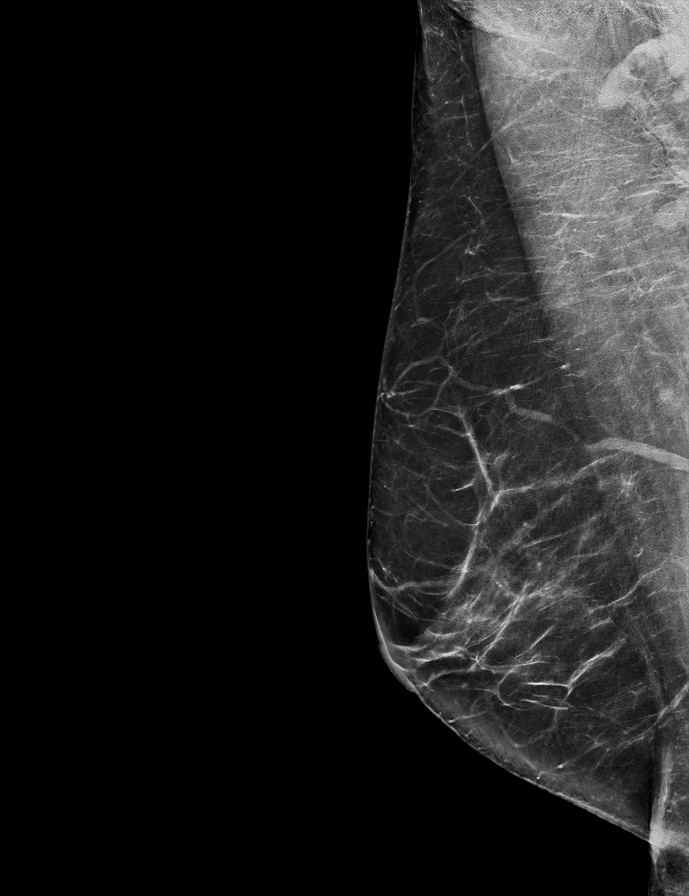

[L CC synth-2D]
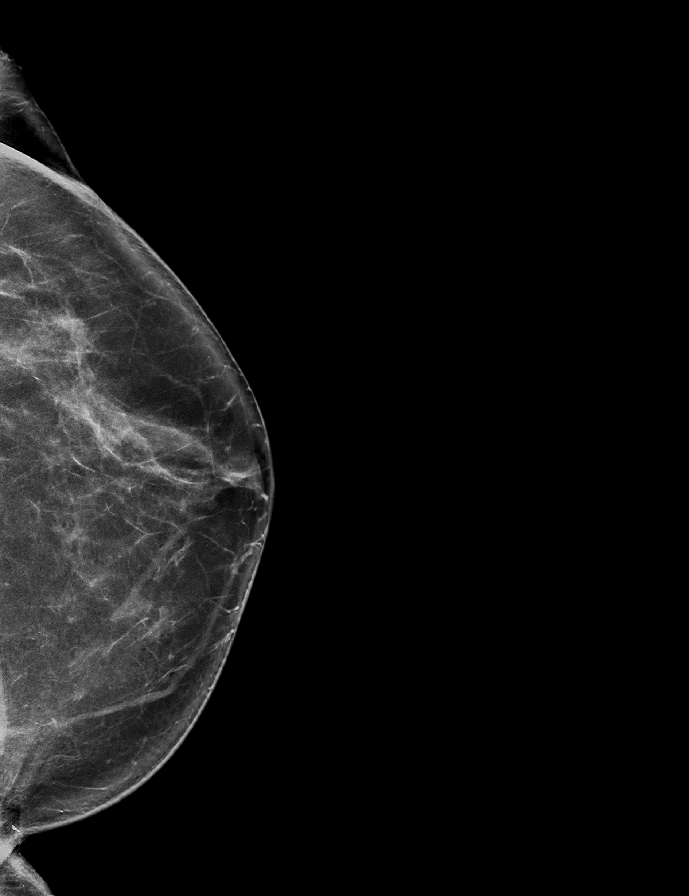

[L MLO synth-2D]
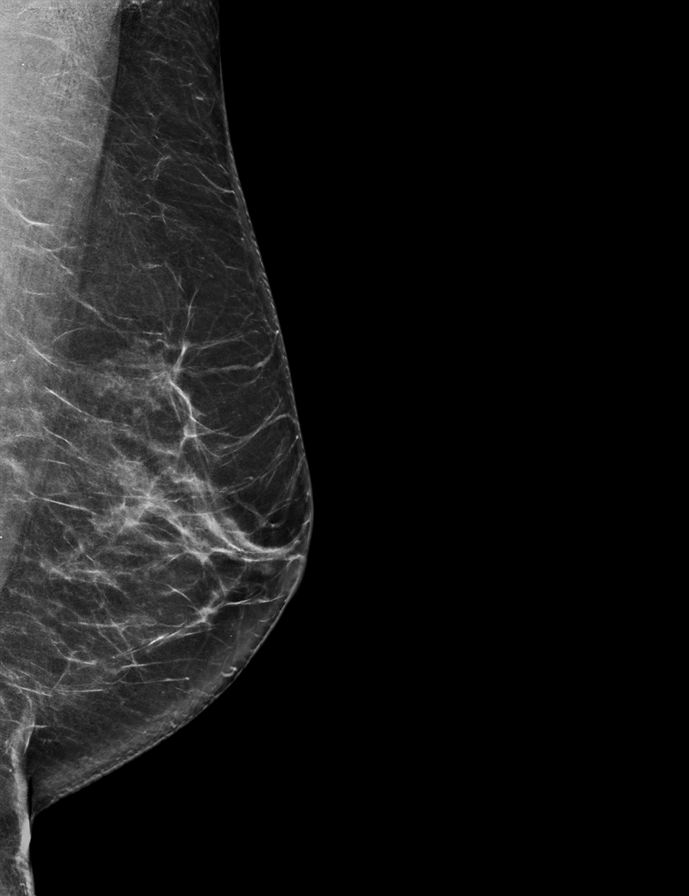

[L MLO tomo · 2 of 72 frames shown]
[frame 24/72]
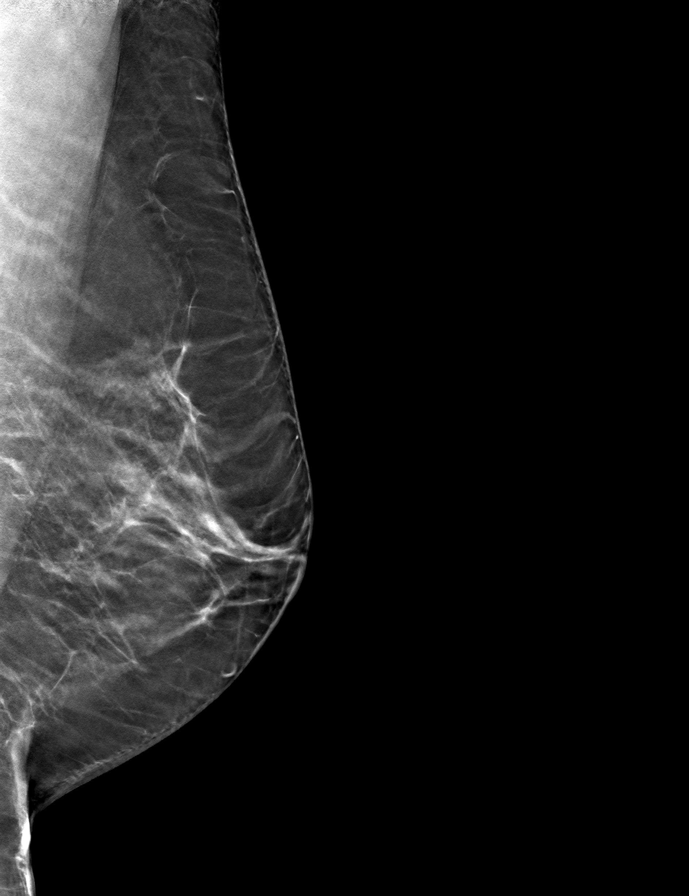
[frame 37/72]
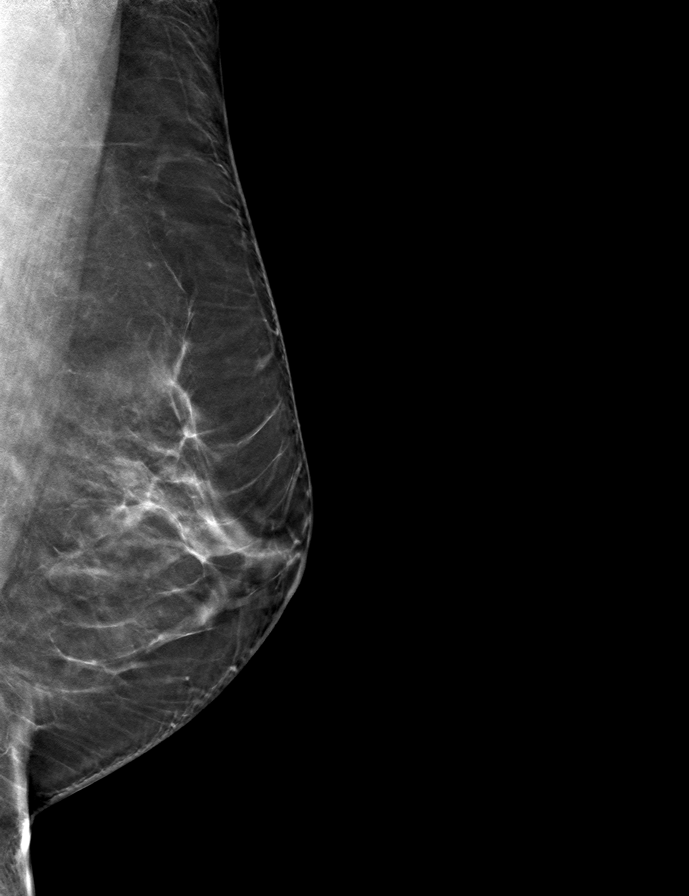

[R MLO tomo · tomo slice 39/76.0]
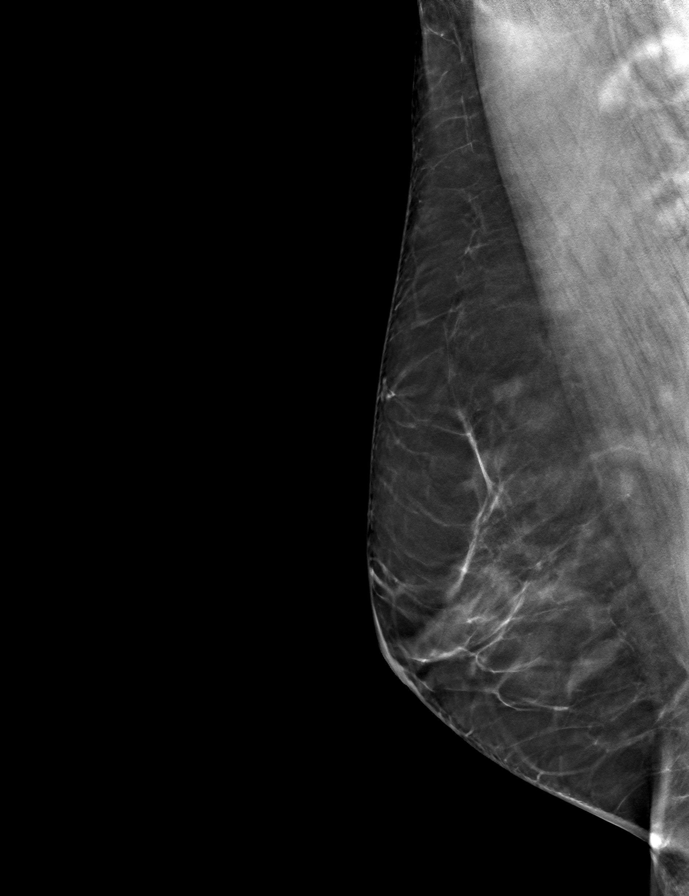

[L CC tomo · tomo slice 39/78.0]
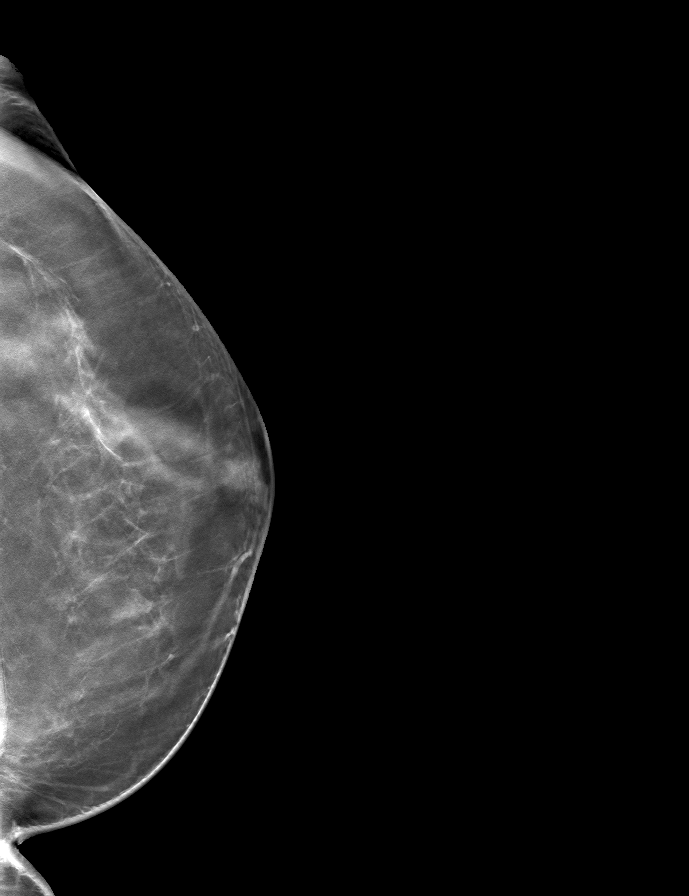

[R CC tomo · tomo slice 35/68.0]
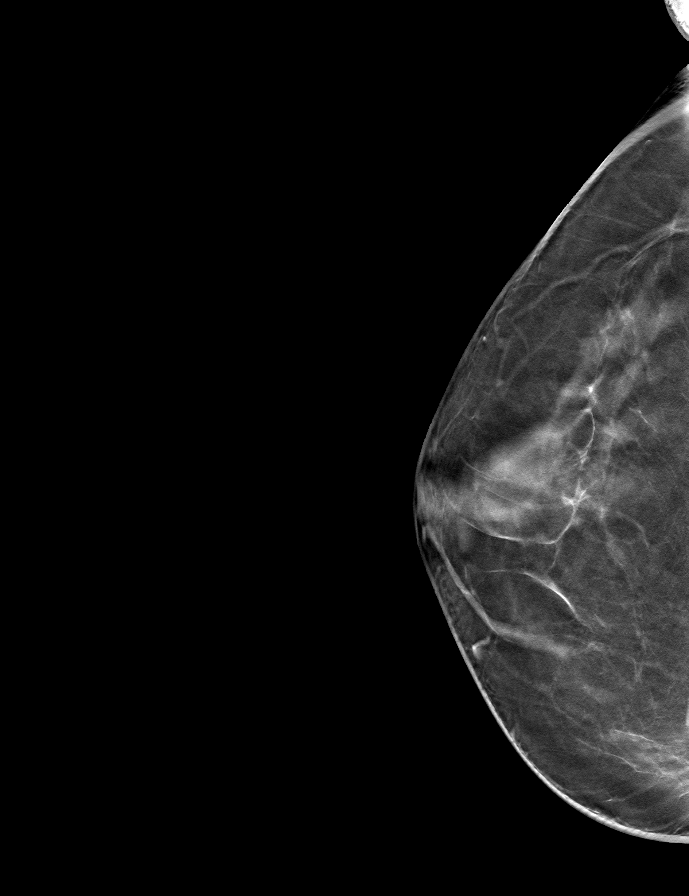

[9 of 24 positions shown; findings below may reference images not displayed]

ACR Breast Density Category b: There are scattered areas of
fibroglandular density.
FINDINGS: There are no findings suspicious for malignancy.
IMPRESSION: No mammographic evidence of malignancy. A result letter of this
screening mammogram will be mailed directly to the patient.

RECOMMENDATION:
Screening mammogram in one year. (Code:51-O-LD2)

BI-RADS CATEGORY  1: Negative.

## 2024-04-25 ENCOUNTER — Other Ambulatory Visit: Payer: Self-pay | Admitting: Medical Genetics

## 2024-05-10 ENCOUNTER — Other Ambulatory Visit

## 2024-05-12 ENCOUNTER — Other Ambulatory Visit: Payer: Self-pay | Admitting: Medical Genetics

## 2024-05-12 DIAGNOSIS — Z006 Encounter for examination for normal comparison and control in clinical research program: Secondary | ICD-10-CM

## 2024-05-26 LAB — GENECONNECT MOLECULAR SCREEN: Genetic Analysis Overall Interpretation: NEGATIVE

## 2024-09-29 ENCOUNTER — Other Ambulatory Visit: Payer: Self-pay | Admitting: Physician Assistant

## 2024-09-29 DIAGNOSIS — Z1231 Encounter for screening mammogram for malignant neoplasm of breast: Secondary | ICD-10-CM

## 2024-10-31 ENCOUNTER — Ambulatory Visit
